# Patient Record
Sex: Female | Born: 1935 | Race: Black or African American | Hispanic: No
Health system: Southern US, Community
[De-identification: ages and names within clinical notes are randomized; demographics above are authoritative.]

## PROBLEM LIST (undated history)

## (undated) DIAGNOSIS — I509 Heart failure, unspecified: Secondary | ICD-10-CM

## (undated) DIAGNOSIS — I10 Essential (primary) hypertension: Secondary | ICD-10-CM

---

## 2015-03-21 ENCOUNTER — Emergency Department (HOSPITAL_COMMUNITY)
Admission: EM | Admit: 2015-03-21 | Discharge: 2015-03-21 | Disposition: A | Discharge status: Home / Self Care | Payer: Medicare (Managed Care) | Attending: Emergency Medicine | Admitting: Emergency Medicine

## 2015-03-21 ENCOUNTER — Emergency Department (HOSPITAL_COMMUNITY): Payer: Medicare (Managed Care)

## 2015-03-21 ENCOUNTER — Encounter (HOSPITAL_COMMUNITY): Payer: Self-pay | Admitting: Emergency Medicine

## 2015-03-21 DIAGNOSIS — Z79899 Other long term (current) drug therapy: Secondary | ICD-10-CM | POA: Diagnosis not present

## 2015-03-21 DIAGNOSIS — R112 Nausea with vomiting, unspecified: Secondary | ICD-10-CM | POA: Insufficient documentation

## 2015-03-21 DIAGNOSIS — R011 Cardiac murmur, unspecified: Secondary | ICD-10-CM | POA: Insufficient documentation

## 2015-03-21 DIAGNOSIS — J90 Pleural effusion, not elsewhere classified: Secondary | ICD-10-CM | POA: Diagnosis not present

## 2015-03-21 DIAGNOSIS — I509 Heart failure, unspecified: Secondary | ICD-10-CM | POA: Diagnosis not present

## 2015-03-21 DIAGNOSIS — R410 Disorientation, unspecified: Secondary | ICD-10-CM | POA: Insufficient documentation

## 2015-03-21 DIAGNOSIS — R350 Frequency of micturition: Secondary | ICD-10-CM | POA: Diagnosis not present

## 2015-03-21 DIAGNOSIS — R2243 Localized swelling, mass and lump, lower limb, bilateral: Secondary | ICD-10-CM | POA: Insufficient documentation

## 2015-03-21 DIAGNOSIS — I10 Essential (primary) hypertension: Secondary | ICD-10-CM | POA: Diagnosis not present

## 2015-03-21 DIAGNOSIS — R531 Weakness: Secondary | ICD-10-CM | POA: Diagnosis present

## 2015-03-21 HISTORY — DX: Essential (primary) hypertension: I10

## 2015-03-21 HISTORY — DX: Heart failure, unspecified: I50.9

## 2015-03-21 LAB — CBC WITH DIFFERENTIAL/PLATELET
BASOS ABS: 0 10*3/uL (ref 0.0–0.1)
BASOS PCT: 1 %
EOS ABS: 0.1 10*3/uL (ref 0.0–0.7)
Eosinophils Relative: 2 %
HCT: 38.1 % (ref 36.0–46.0)
Hemoglobin: 12.1 g/dL (ref 12.0–15.0)
Lymphocytes Relative: 25 %
Lymphs Abs: 1.2 10*3/uL (ref 0.7–4.0)
MCH: 22.6 pg — ABNORMAL LOW (ref 26.0–34.0)
MCHC: 31.8 g/dL (ref 30.0–36.0)
MCV: 71.1 fL — ABNORMAL LOW (ref 78.0–100.0)
MONO ABS: 0.4 10*3/uL (ref 0.1–1.0)
MONOS PCT: 8 %
NEUTROS PCT: 64 %
Neutro Abs: 3.1 10*3/uL (ref 1.7–7.7)
Platelets: 223 10*3/uL (ref 150–400)
RBC: 5.36 MIL/uL — ABNORMAL HIGH (ref 3.87–5.11)
RDW: 16.2 % — ABNORMAL HIGH (ref 11.5–15.5)
WBC: 4.8 10*3/uL (ref 4.0–10.5)

## 2015-03-21 LAB — I-STAT CHEM 8, ED
BUN: 17 mg/dL (ref 6–20)
CALCIUM ION: 1.18 mmol/L (ref 1.13–1.30)
CHLORIDE: 103 mmol/L (ref 101–111)
Creatinine, Ser: 0.7 mg/dL (ref 0.44–1.00)
GLUCOSE: 127 mg/dL — AB (ref 65–99)
HCT: 44 % (ref 36.0–46.0)
Hemoglobin: 15 g/dL (ref 12.0–15.0)
Potassium: 4.1 mmol/L (ref 3.5–5.1)
SODIUM: 140 mmol/L (ref 135–145)
TCO2: 25 mmol/L (ref 0–100)

## 2015-03-21 LAB — BASIC METABOLIC PANEL
ANION GAP: 8 (ref 5–15)
BUN: 15 mg/dL (ref 6–20)
CHLORIDE: 103 mmol/L (ref 101–111)
CO2: 25 mmol/L (ref 22–32)
Calcium: 9.1 mg/dL (ref 8.9–10.3)
Creatinine, Ser: 0.72 mg/dL (ref 0.44–1.00)
Glucose, Bld: 122 mg/dL — ABNORMAL HIGH (ref 65–99)
POTASSIUM: 4.1 mmol/L (ref 3.5–5.1)
Sodium: 136 mmol/L (ref 135–145)

## 2015-03-21 LAB — MAGNESIUM: MAGNESIUM: 2.1 mg/dL (ref 1.7–2.4)

## 2015-03-21 LAB — URINALYSIS, ROUTINE W REFLEX MICROSCOPIC
BILIRUBIN URINE: NEGATIVE
Glucose, UA: NEGATIVE mg/dL
HGB URINE DIPSTICK: NEGATIVE
KETONES UR: NEGATIVE mg/dL
NITRITE: NEGATIVE
Protein, ur: 30 mg/dL — AB
SPECIFIC GRAVITY, URINE: 1.016 (ref 1.005–1.030)
UROBILINOGEN UA: 1 mg/dL (ref 0.0–1.0)
pH: 6.5 (ref 5.0–8.0)

## 2015-03-21 LAB — URINE MICROSCOPIC-ADD ON

## 2015-03-21 LAB — PROTIME-INR
INR: 1.12 (ref 0.00–1.49)
PROTHROMBIN TIME: 14.6 s (ref 11.6–15.2)

## 2015-03-21 LAB — I-STAT TROPONIN, ED: TROPONIN I, POC: 0.02 ng/mL (ref 0.00–0.08)

## 2015-03-21 LAB — BRAIN NATRIURETIC PEPTIDE: B NATRIURETIC PEPTIDE 5: 2201.7 pg/mL — AB (ref 0.0–100.0)

## 2015-03-21 MED ORDER — FUROSEMIDE 20 MG PO TABS
40.0000 mg | ORAL_TABLET | Freq: Once | ORAL | Status: AC
  Administered 2015-03-21: 40 mg via ORAL
  Filled 2015-03-21: qty 2

## 2015-03-21 MED ORDER — ACETAMINOPHEN 325 MG PO TABS
650.0000 mg | ORAL_TABLET | Freq: Once | ORAL | Status: AC
  Administered 2015-03-21: 650 mg via ORAL
  Filled 2015-03-21: qty 2

## 2015-03-21 MED ORDER — POTASSIUM CHLORIDE ER 10 MEQ PO TBCR: 10.0000 meq | EXTENDED_RELEASE_TABLET | Freq: Every day | ORAL | Status: AC

## 2015-03-21 NOTE — ED Provider Notes (Signed)
CSN: [id number]     Arrival date & time 03/21/15  [id number] History   First MD Initiated Contact with Patient 03/21/15 [phone]     Chief Complaint  Patient presents with  . Weakness     (Consider location/radiation/quality/duration/timing/severity/associated sxs/prior Treatment) HPI Comments: Patient with history of CHF (lasix), pacemaker, HTN (metoprolol) -- presents with c/o weakness, vomiting, confusion for less than 24 hours. Started yesterday afternoon. Patient c/o of generalized fatigue. Later last evening, patient had poor appetite at dinner, several episodes of vomiting with frequent urination. Daughter states that she has been confused as to where she is, and at times feels that she is still in [place]. No fevers, URI symptoms, chest pain, abdominal pain. No shortness of breath except when she stands up, this is not unusual. No burning with urination. No diarrhea or blood in the stool. No skin rash. Patient has her baseline lower extremity swelling which is not worse than usual. Patient states that she had a fall proximally one week ago and has residual right hip pain from that. She has been ambulatory with walker per her baseline. No treatments prior to arrival. Another daughter who is a nurse was concerned so she called the ambulance for the patient this morning.  Patient is a 81 y.o. female presenting with weakness. The history is provided by the patient, medical records and a relative.  Weakness Associated symptoms include vomiting and weakness. Pertinent negatives include no abdominal pain, chest pain, coughing, fever, headaches, myalgias, nausea, rash or sore throat.    Past Medical History  Diagnosis Date  . Hypertension   . CHF (congestive heart failure) (HCC)    History reviewed. No pertinent past surgical history. History reviewed. No pertinent family history. Social History  Substance Use Topics  . Smoking status: None  . Smokeless tobacco: None  . Alcohol Use: No   OB  History    No data available     Review of Systems  Constitutional: Negative for fever.  HENT: Negative for rhinorrhea and sore throat.   Eyes: Negative for redness.  Respiratory: Negative for cough.   Cardiovascular: Negative for chest pain.  Gastrointestinal: Positive for vomiting. Negative for nausea, abdominal pain, diarrhea and blood in stool.  Genitourinary: Positive for frequency. Negative for dysuria and hematuria.  Musculoskeletal: Negative for myalgias.  Skin: Negative for rash.  Neurological: Positive for weakness. Negative for headaches.      Allergies  Review of patient's allergies indicates no known allergies.  Home Medications   Prior to Admission medications   Medication Sig Start Date End Date Taking? Authorizing Provider  furosemide (LASIX) 40 MG tablet Take 40 mg by mouth daily as needed for fluid.   Yes Historical Provider, MD  metoprolol succinate (TOPROL-XL) 100 MG 24 hr tablet Take 100 mg by mouth daily. Take with or immediately following a meal.   Yes Historical Provider, MD  Multiple Vitamin (MULTIVITAMIN WITH MINERALS) TABS tablet Take 1 tablet by mouth daily.   Yes Historical Provider, MD  omeprazole (PRILOSEC) 20 MG capsule Take 20 mg by mouth daily.   Yes Historical Provider, MD  sertraline (ZOLOFT) 50 MG tablet Take 50 mg by mouth daily.   Yes Historical Provider, MD  tolterodine (DETROL LA) 4 MG 24 hr capsule Take 4 mg by mouth daily.   Yes Historical Provider, MD  traMADol (ULTRAM) 50 MG tablet Take 50 mg by mouth every 12 (twelve) hours as needed for severe pain.   Yes Historical Provider, MD   BP  168/90 mmHg  Pulse 69  Temp(Src) 98.7 F (37.1 C) (Rectal)  SpO2 97%   Physical Exam  Constitutional: She appears well-developed and well-nourished.  HENT:  Head: Normocephalic and atraumatic.  Mouth/Throat: Oropharynx is clear and moist.  Eyes: Conjunctivae are normal. Right eye exhibits no discharge. Left eye exhibits no discharge.  Neck:  Normal range of motion. Neck supple. No JVD present.  Cardiovascular: Normal rate and regular rhythm.   Murmur (2/6, left sternal border, systolic) heard. Pulmonary/Chest: Effort normal and breath sounds normal. No respiratory distress. She has no wheezes. She has no rales.  Abdominal: Soft. Bowel sounds are normal. There is no tenderness. There is no rebound and no guarding.  Musculoskeletal: She exhibits tenderness.  1+ lower extremity edema bilaterally to the mid calves.   Neurological: She is alert.  Patient had to be reminded during interview that she is in [place] and not in [place].   Skin: Skin is warm and dry.  Psychiatric: She has a normal mood and affect.  Nursing note and vitals reviewed.   ED Course  Procedures (including critical care time) Labs Review Labs Reviewed  CBC WITH DIFFERENTIAL/PLATELET - Abnormal; Notable for the following:    RBC 5.36 (*)    MCV 71.1 (*)    MCH 22.6 (*)    RDW 16.2 (*)    All other components within normal limits  BRAIN NATRIURETIC PEPTIDE - Abnormal; Notable for the following:    B Natriuretic Peptide 2201.7 (*)    All other components within normal limits  BASIC METABOLIC PANEL - Abnormal; Notable for the following:    Glucose, Bld 122 (*)    All other components within normal limits  URINALYSIS, ROUTINE W REFLEX MICROSCOPIC (NOT AT [facility]) - Abnormal; Notable for the following:    Protein, ur 30 (*)    Leukocytes, UA TRACE (*)    All other components within normal limits  I-STAT CHEM 8, ED - Abnormal; Notable for the following:    Glucose, Bld 127 (*)    All other components within normal limits  URINE CULTURE  PROTIME-INR  MAGNESIUM  URINE MICROSCOPIC-ADD ON  I-STAT TROPOININ, ED  CBG MONITORING, ED    Imaging Review Dg Chest 2 View  03/21/2015  CLINICAL DATA:  Weakness, frequent urination, vomiting for 1 day. History of CHF and hypertension. EXAM: CHEST  2 VIEW COMPARISON:  None. FINDINGS: The cardiac  silhouette is mild to moderately enlarged. Calcified aortic knob. Diffuse interstitial prominence. Strandy densities LEFT mid and lower lung zone. Small suspected LEFT pleural on the lateral radiograph though limited assessment due to parenchymal opacities. Single lead LEFT cardiac pacemaker lead with distal tip projecting in RIGHT ventricle. No pneumothorax. Patient is osteopenic. Moderate degenerative change of the thoracic spine. IMPRESSION: Mild to moderate cardiomegaly. Diffuse interstitial prominence could represent pulmonary edema or atypical infection with LEFT mid and lower lung zone atelectasis. Small suspected LEFT pleural effusion. Electronically Signed   By: Awilda Metro M.D.   On: 03/21/2015 06:34   Ct Head Wo Contrast  03/21/2015  CLINICAL DATA:  Status post fall.  Confusion. EXAM: CT HEAD WITHOUT CONTRAST TECHNIQUE: Contiguous axial images were obtained from the base of the skull through the vertex without intravenous contrast. COMPARISON:  None. FINDINGS: Prominence of the sulci and ventricles consistent with brain atrophy. There is mild diffuse low attenuation throughout the subcortical and periventricular white matter compatible with chronic microvascular disease. No evidence for acute intracranial hemorrhage. No abnormal extra-axial fluid collections or  mass. No acute brain infarct. IMPRESSION: 1. No acute intracranial abnormalities. 2. Chronic microvascular disease and brain atrophy. Electronically Signed   By: Signa Kell M.D.   On: 03/21/2015 11:23   Ct Chest Wo Contrast  03/21/2015  CLINICAL DATA:  81 year old female with weakness today. History of congestive heart failure. Emesis. Contusion. EXAM: CT CHEST WITHOUT CONTRAST TECHNIQUE: Multidetector CT imaging of the chest was performed following the standard protocol without IV contrast. COMPARISON:  No priors. FINDINGS: Mediastinum/Lymph Nodes: Heart size is mildly enlarged. There is no significant pericardial fluid, thickening  or pericardial calcification. There is atherosclerosis of the thoracic aorta, the great vessels of the mediastinum and the coronary arteries, including calcified atherosclerotic plaque in the left anterior descending, left circumflex and right coronary arteries. Calcifications of the mitral annulus. Widespread mediastinal and bilateral hilar lymphadenopathy, with some of the largest lymph nodes measuring up to 2.2 cm in short axis in the superior mediastinum posterior and lateral to the proximal trachea. Esophagus is unremarkable in appearance. No axillary lymphadenopathy. Lungs/Pleura: Moderate right and small left pleural effusions lying dependently associated with some areas of passive subsegmental atelectasis throughout the lower lobes of the lungs bilaterally. There is rather diffuse thickening of the peribronchovascular interstitium with extensive micro and macronodularity scattered throughout the lungs bilaterally. The nodules have irregular margins and are somewhat ill-defined. Nodules do not appear to be randomly distributed, but rather appeared to be predominantly peribronchovascular or subpleural in location (i.e., this appears represent a perilymphatic distribution of nodularity). Some of the larger nodules include a 1.8 x 1.0 cm nodule in the posterior right upper lobe (image 14 of series 3), a 1.6 x 0.7 cm nodule in the right lower lobe (image 36 of series 3), and a 1.0 x 0.7 cm nodule in the apex of the left upper lobe (image 9 of series 3). Diffuse thickening of the interlobular septae. Upper Abdomen: Well-defined 1.2 cm low-attenuation lesion posterior aspect of segment 6 of the liver is incompletely characterized on today's noncontrast CT examination, but is statistically likely to represent a cyst. Prominent soft tissue throughout the upper abdomen, particularly in the portacaval and hepatoduodenal ligament nodal stations, likely to represent lymphadenopathy, poorly demonstrated on today's  noncontrast CT examination. Atherosclerosis. Musculoskeletal/Soft Tissues: There are no aggressive appearing lytic or blastic lesions noted in the visualized portions of the skeleton. IMPRESSION: 1. Widespread predominantly perilymphatic micro and macronodularity which has a slight mid to upper lung predominance, and is associated with mediastinal and bilateral hilar lymphadenopathy. Findings are favored to reflect a systemic disease such as sarcoidosis. Atypical infection could have a similar appearance, however, and clinical correlation is recommended. The non random distribution of nodules, and mid to upper lung predominance of nodules suggests against metastatic disease. 2. Mild cardiomegaly with interlobular septal thickening and moderate right and small left pleural effusions; imaging findings suggestive of concurrent congestive heart failure. 3. Atherosclerosis, including left main and 3 vessel coronary artery disease. 4. Additional findings, as above. Electronically Signed   By: Trudie Reed M.D.   On: 03/21/2015 09:52   I have personally reviewed and evaluated these images and lab results as part of my medical decision-making.   EKG Interpretation   Date/Time:  Saturday March 21 2015 05:45:52 EDT Ventricular Rate:  71 PR Interval:    QRS Duration: 171 QT Interval:  488 QTC Calculation: 530 R Axis:   124 Text Interpretation:  VENTRICULAR PACED RHYTHM no scarbossa criteria No  old tracing to compare Confirmed by Tomasita Crumble  Ay[place] [phone]) on  03/21/2015 5:50:26 AM      6:40 AM Patient seen and examined. Work-up initiated. Medications ordered.   Vital signs reviewed and are as follows: BP 168/90 mmHg  Pulse 69  Temp(Src) 98.7 F (37.1 C) (Rectal)  SpO2 97%  Patient discussed with and seen by Dr. Deretha Emory.   Workup performed as above. CT chest was ordered to further evaluate pneumonia versus congestive heart failure. This shows fluid in the lungs. Patient has been able  to ambulate, maintaining her pulse ox. Family is comfortable with her ambulation. Head CT was ordered to ensure no other reason for her confusion especially after a recent fall for which she was evaluated in [place]. This was negative.  Plan is for patient to take her Lasix routinely, every day. She was prescribed potassium supplementation. Family will follow up with her primary care physician in [place] to establish a plan. Patient will be in [place] for the next month visiting. No further vomiting in emergency department. Patient is close to her baseline; family has no concerns about caring for her at home. Daughter is a Engineer, civil (consulting) here and is very knowledgeable about the patient's care and history. I'm comfortable with her being discharged home at this time given workup, vital signs, ability to ambulate.  Informed of CT findings including appearance possibly consistent with sarcoidosis. Patient does not have a history of sarcoidosis.  MDM   Final diagnoses:  Non-intractable vomiting with nausea, vomiting of unspecified type  Congestive heart failure, unspecified congestive heart failure chronicity, unspecified congestive heart failure type (HCC)  Pleural effusion   Patient presents with weakness, nausea and vomiting. Workup performed and was relatively normal without infection in the chest or lungs. CT ordered to evaluate for pneumonia, however this was negative. Possible sarcoidosis. Patient does have some degree of heart failure but has not been taking her Lasix regularly. She can ambulate at baseline using a walker without oxygen desaturation. She has a supportive family who are well involved in her medical care. Patient wants to go home. Plan as above. Return instructions discussed with family at bedside.  No dangerous or life-threatening conditions suspected or identified by history, physical exam, and by work-up. No indications for hospitalization identified.       Renne Crigler, PA-C 03/21/15 1145  Vanetta Mulders, MD 03/21/15 1204

## 2015-03-21 NOTE — ED Notes (Signed)
PA at bedside.

## 2015-03-21 NOTE — ED Notes (Signed)
Pt ambulated well in hallway with walker which is what she uses at home. Pt spO2 98-100% and HR 76-85.

## 2015-03-21 NOTE — Discharge Instructions (Signed)
Please read and follow all provided instructions.  Your diagnoses today include:  1. Non-intractable vomiting with nausea, vomiting of unspecified type   2. Congestive heart failure, unspecified congestive heart failure chronicity, unspecified congestive heart failure type (HCC)   3. Pleural effusion     Tests performed today include:  Blood counts and electrolytes  Chest CT - shows some fluid in your lungs related to congestive heart failure and findings suggestive of sarcoidosis  Head CT - no concerning findings such as bleeding or stroke  Vital signs. See below for your results today.   Medications prescribed:   Potassium  Take any prescribed medications only as directed.  Home care instructions:  Follow any educational materials contained in this packet.  Please take your Lasix once a day as prescribed.   Do not take Zoloft if it is making you feel bad or sick.  BE VERY CAREFUL not to take multiple medicines containing Tylenol (also called acetaminophen). Doing so can lead to an overdose which can damage your liver and cause liver failure and possibly death.   Follow-up instructions: Please follow-up with your primary care provider in the next 3 days for further evaluation of your symptoms.   Return instructions:   Please return to the Emergency Department if you experience worsening symptoms.   Return with chest pain, worsening shortness of breath, worsening confusion Return if you have weakness in your arms or legs, slurred speech, trouble walking or talking, confusion, or trouble with your balance.   Please return if you have any other emergent concerns.  Additional Information:  Your vital signs today were: BP 159/75 mmHg   Pulse 71   Temp(Src) 97.8 F (36.6 C) (Oral)   Resp 21   SpO2 96% If your blood pressure (BP) was elevated above 135/85 this visit, please have this repeated by your doctor within one month. --------------

## 2015-03-21 NOTE — ED Notes (Signed)
Per EMS, patient's daughter stated that the patient has been feeling weak today. Pt with CHF history and on lasix. Daughter is an Charity fundraiserN here and concerned that patients electolytes may be abnormal. Pt vomited earlier in the evening. Pt is conscious, but has some confusion.

## 2015-03-22 LAB — URINE CULTURE

## 2017-04-17 IMAGING — CT CT CHEST W/O CM
2 of 4 series · 14 of 36 positions shown, 17 images · non-contrast
Comparison: No priors.

CLINICAL DATA: 87-year-old female with weakness today. History of
congestive heart failure. Emesis. Contusion.

EXAM:
CT CHEST WITHOUT CONTRAST
TECHNIQUE: Multidetector CT imaging of the chest was performed following the
standard protocol without IV contrast.

[Series 4: chest w/o 3mm st cor · coronal · non-contrast · 0.59mm/px · 3 of 83 slices shown]
[im 17/83  lung]
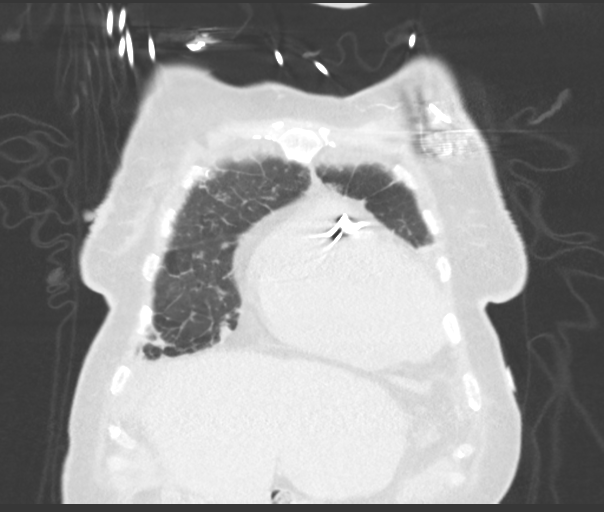
[im 33/83  lung]
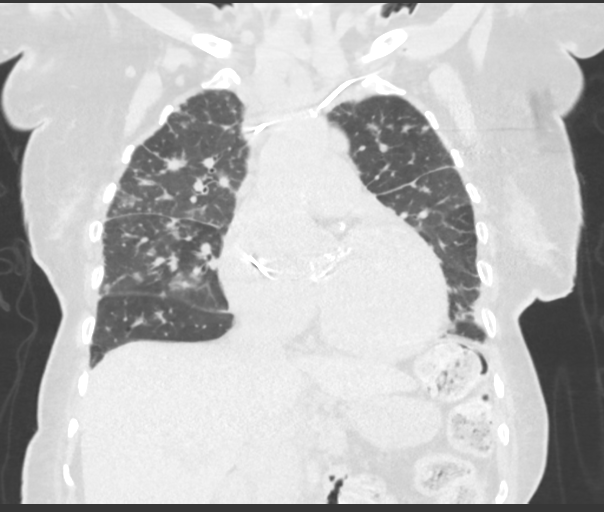
[im 50/83  lung]
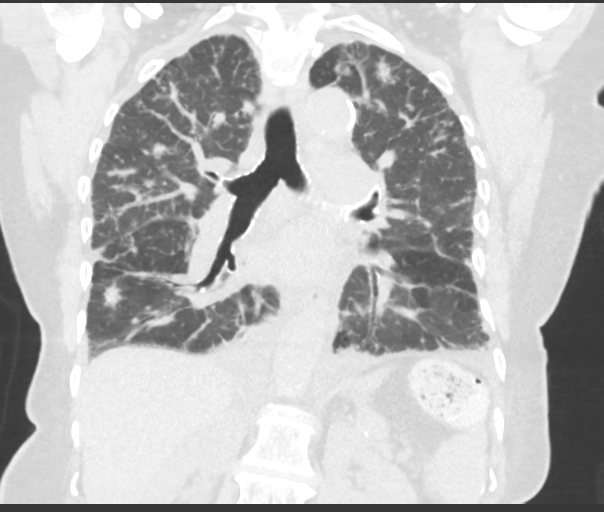

[Series 6: chest w/o 1mm st · axial · non-contrast · 0.80mm/px · z∈[+1127,+1381]mm · 11 of 366 slices shown, 14 images]
[im 32/366  mediastinal]
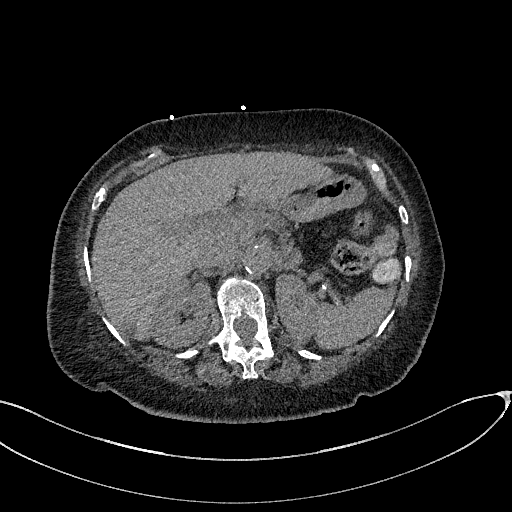
[im 32/366  lung]
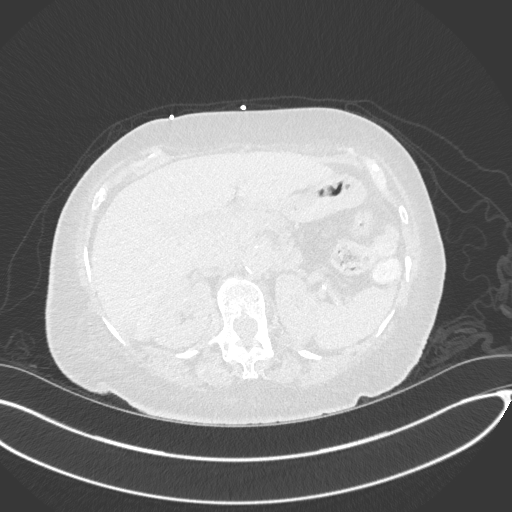
[im 64/366  lung]
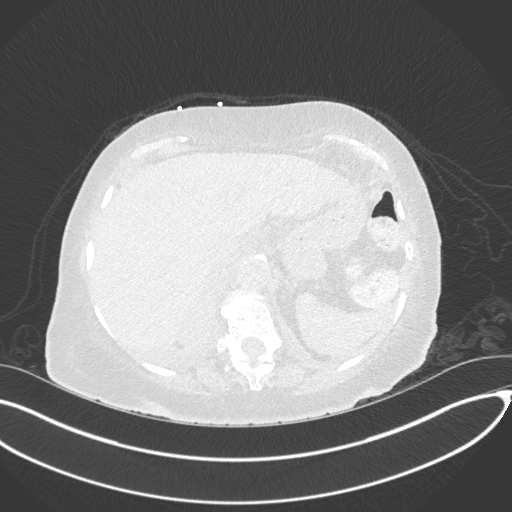
[im 96/366  lung]
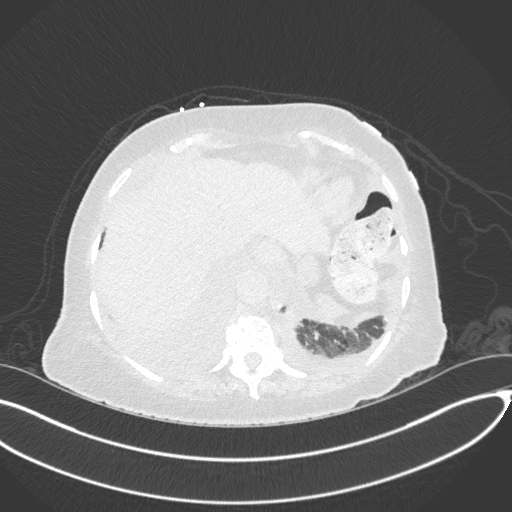
[im 127/366  lung]
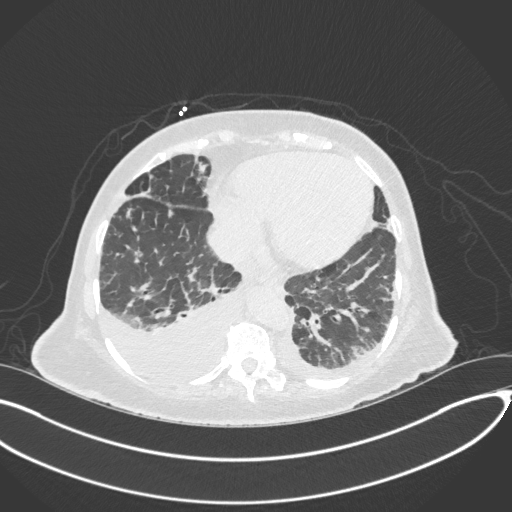
[im 159/366  mediastinal]
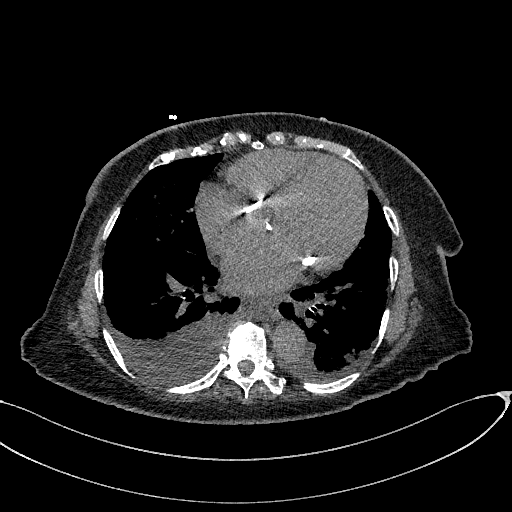
[im 159/366  lung]
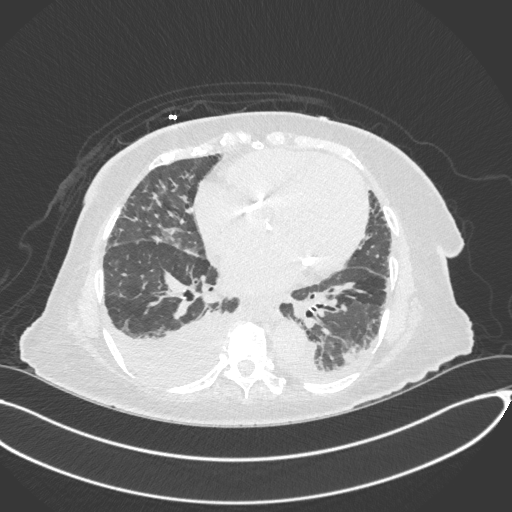
[im 191/366  lung]
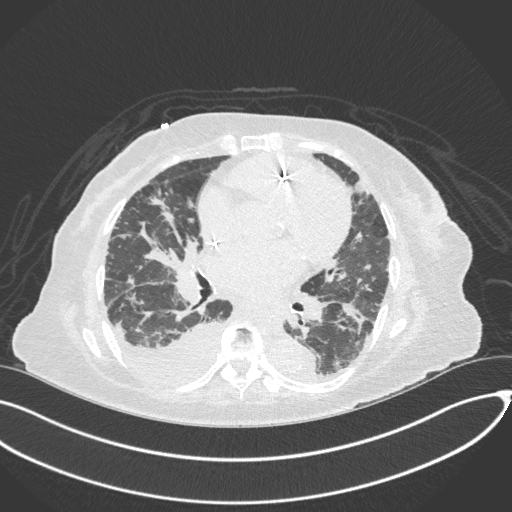
[im 223/366  lung]
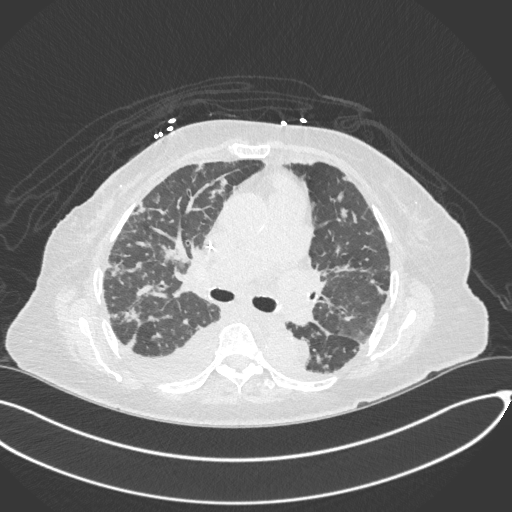
[im 254/366  lung]
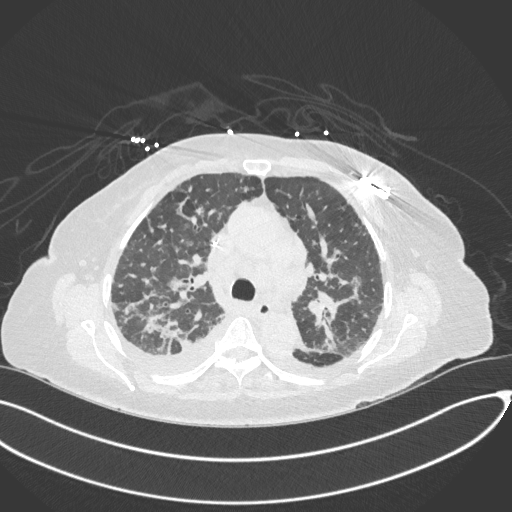
[im 286/366  mediastinal]
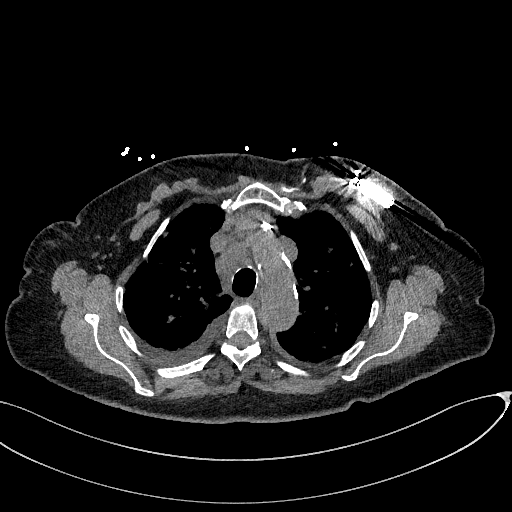
[im 286/366  lung]
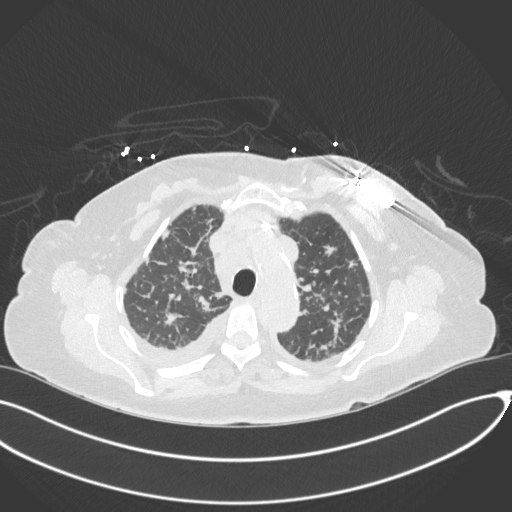
[im 318/366  lung]
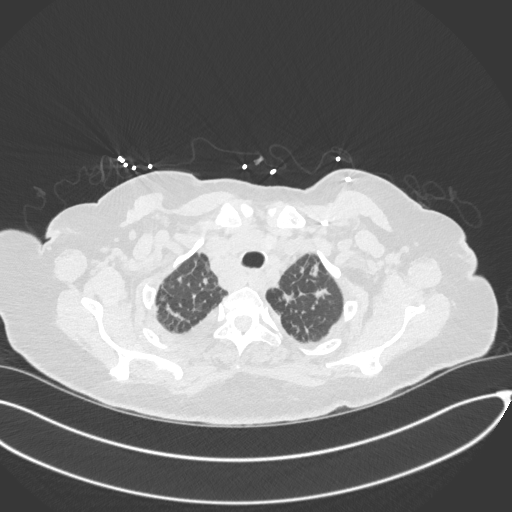
[im 350/366  lung]
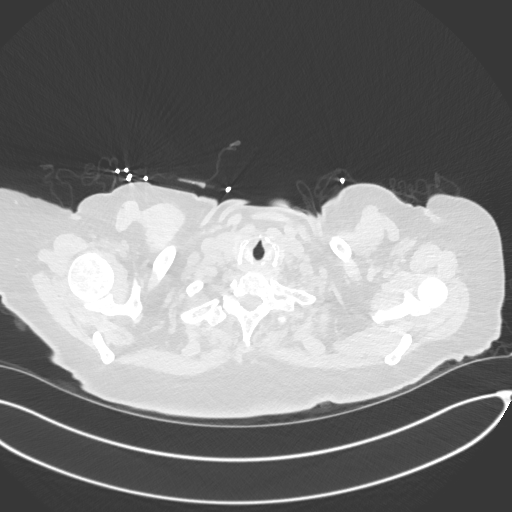

[14 of 36 positions shown; findings below may reference images not displayed]

FINDINGS: Mediastinum/Lymph Nodes: Heart size is mildly enlarged. There is no
significant pericardial fluid, thickening or pericardial
calcification. There is atherosclerosis of the thoracic aorta, the
great vessels of the mediastinum and the coronary arteries,
including calcified atherosclerotic plaque in the left anterior
descending, left circumflex and right coronary arteries.
Calcifications of the mitral annulus. Widespread mediastinal and
bilateral hilar lymphadenopathy, with some of the largest lymph
nodes measuring up to 2.2 cm in short axis in the superior
mediastinum posterior and lateral to the proximal trachea. Esophagus
is unremarkable in appearance. No axillary lymphadenopathy.

Lungs/Pleura: Moderate right and small left pleural effusions lying
dependently associated with some areas of passive subsegmental
atelectasis throughout the lower lobes of the lungs bilaterally.
There is rather diffuse thickening of the peribronchovascular
interstitium with extensive micro and macronodularity scattered
throughout the lungs bilaterally. The nodules have irregular margins
and are somewhat ill-defined. Nodules do not appear to be randomly
distributed, but rather appeared to be predominantly
peribronchovascular or subpleural in location (i.e., this appears
represent a perilymphatic distribution of nodularity). Some of the
larger nodules include a 1.8 x 1.0 cm nodule in the posterior right
upper lobe (image 14 of series 3), a 1.6 x 0.7 cm nodule in the
right lower lobe (image 36 of series 3), and a 1.0 x 0.7 cm nodule
in the apex of the left upper lobe (image 9 of series 3). Diffuse
thickening of the interlobular septae.

Upper Abdomen: Well-defined 1.2 cm low-attenuation lesion posterior
aspect of segment 6 of the liver is incompletely characterized on
today's noncontrast CT examination, but is statistically likely to
represent a cyst. Prominent soft tissue throughout the upper
abdomen, particularly in the portacaval and hepatoduodenal ligament
nodal stations, likely to represent lymphadenopathy, poorly
demonstrated on today's noncontrast CT examination. Atherosclerosis.

Musculoskeletal/Soft Tissues: There are no aggressive appearing
lytic or blastic lesions noted in the visualized portions of the
skeleton.
IMPRESSION: 1. Widespread predominantly perilymphatic micro and macronodularity
which has a slight mid to upper lung predominance, and is associated
with mediastinal and bilateral hilar lymphadenopathy. Findings are
favored to reflect a systemic disease such as sarcoidosis. Atypical
infection could have a similar appearance, however, and clinical
correlation is recommended. The non random distribution of nodules,
and mid to upper lung predominance of nodules suggests against
metastatic disease.
2. Mild cardiomegaly with interlobular septal thickening and
moderate right and small left pleural effusions; imaging findings
suggestive of concurrent congestive heart failure.
3. Atherosclerosis, including left main and 3 vessel coronary artery
disease.
4. Additional findings, as above.

## 2022-03-25 ENCOUNTER — Other Ambulatory Visit (HOSPITAL_COMMUNITY): Payer: Self-pay
